# Patient Record
Sex: Male | Born: 1990 | Race: Black or African American | Hispanic: No | Marital: Single | Smoking: Never smoker
Health system: Southern US, Community
[De-identification: ages and names within clinical notes are randomized; demographics above are authoritative.]

---

## 2012-10-08 ENCOUNTER — Emergency Department (HOSPITAL_COMMUNITY)
Admission: EM | Admit: 2012-10-08 | Discharge: 2012-10-09 | Disposition: A | Discharge status: Home / Self Care | Payer: Medicaid Other | Attending: Emergency Medicine | Admitting: Emergency Medicine

## 2012-10-08 DIAGNOSIS — S81812A Laceration without foreign body, left lower leg, initial encounter: Secondary | ICD-10-CM

## 2012-10-08 DIAGNOSIS — S6980XA Other specified injuries of unspecified wrist, hand and finger(s), initial encounter: Secondary | ICD-10-CM | POA: Insufficient documentation

## 2012-10-08 DIAGNOSIS — S6990XA Unspecified injury of unspecified wrist, hand and finger(s), initial encounter: Secondary | ICD-10-CM | POA: Insufficient documentation

## 2012-10-08 DIAGNOSIS — Y9289 Other specified places as the place of occurrence of the external cause: Secondary | ICD-10-CM | POA: Insufficient documentation

## 2012-10-08 DIAGNOSIS — Y9389 Activity, other specified: Secondary | ICD-10-CM | POA: Insufficient documentation

## 2012-10-08 DIAGNOSIS — S6992XA Unspecified injury of left wrist, hand and finger(s), initial encounter: Secondary | ICD-10-CM

## 2012-10-08 DIAGNOSIS — IMO0002 Reserved for concepts with insufficient information to code with codable children: Secondary | ICD-10-CM | POA: Insufficient documentation

## 2012-10-08 DIAGNOSIS — S81009A Unspecified open wound, unspecified knee, initial encounter: Secondary | ICD-10-CM | POA: Insufficient documentation

## 2012-10-08 NOTE — ED Notes (Signed)
Pt states he was at the [facility] jumping up on platform and hit his knee. LAC to left knee approximately 2 inches

## 2012-10-09 ENCOUNTER — Emergency Department (HOSPITAL_COMMUNITY): Payer: Medicaid Other

## 2012-10-09 MED ORDER — BACITRACIN 500 UNIT/GM EX OINT
1.0000 "application " | TOPICAL_OINTMENT | Freq: Two times a day (BID) | CUTANEOUS | Status: DC
  Administered 2012-10-09: 1 via TOPICAL

## 2012-10-09 NOTE — ED Notes (Signed)
Suture cart at bedside 

## 2012-10-09 NOTE — ED Notes (Signed)
Pt reports last tetanus was in 2006.

## 2012-10-09 NOTE — ED Provider Notes (Signed)
CSN: [id number]     Arrival date & time 10/08/12  2209 History   First MD Initiated Contact with Patient 10/08/12 2354     Chief Complaint  Patient presents with  . Leg Injury   (Consider location/radiation/quality/duration/timing/severity/associated sxs/prior Treatment) HPI Comments: At the Gym hit leg in shelf now with abrasion/laceration to anterior left shin  Also states he dislocated his left 5th finger --pulled on it and got it straight but now can not straighten the finger.   The history is provided by the patient.    No past medical history on file. No past surgical history on file. No family history on file. History  Substance Use Topics  . Smoking status: Not on file  . Smokeless tobacco: Not on file  . Alcohol Use: Not on file    Review of Systems  Constitutional: Negative for fever.  Musculoskeletal: Positive for arthralgias.  Skin: Positive for wound.  All other systems reviewed and are negative.    Allergies  Review of patient's allergies indicates no known allergies.  Home Medications  No current outpatient prescriptions on file. BP 126/66  Pulse 78  Temp(Src) 98.9 F (37.2 C) (Oral)  Resp 19  SpO2 99% Physical Exam  Nursing note and vitals reviewed. Constitutional: He is oriented to person, place, and time. He appears well-developed and well-nourished.  HENT:  Head: Normocephalic.  Eyes: Pupils are equal, round, and reactive to light.  Cardiovascular: Normal rate.   Pulmonary/Chest: Effort normal.  Musculoskeletal: He exhibits tenderness. He exhibits no edema.       Hands: Neurological: He is alert and oriented to person, place, and time.  Skin: Laceration noted.     superfical abrasion to anterior shin Laceration.5 CM  to anterior shin     ED Course  Procedures (including critical care time) Labs Review Labs Reviewed - No data to display Imaging Review Dg Finger Little Left  10/09/2012   *RADIOLOGY REPORT*  Clinical Data: Trauma 1 week  to go.  LEFT LITTLE FINGER 2+V  Comparison: None available at time of study interpretation.  Findings: No acute fracture deformity or dislocation. The fifth proximal interphalangeal joint is flexed in all views.  Mild spurring of the base of the fifth proximal phalanx.  Joint space intact without erosions.  No destructive bony lesions. Mild soft tissue swelling without subcutaneous gas nor radiopaque foreign bodies.  IMPRESSION: No acute fracture deformity dislocation.  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   Original Report Authenticated By: Awilda Metro    MDM   1. Laceration of lower leg, left, initial encounter   2. Finger injury, left, initial encounter    Patient placed in finger splint to call DR. Coley on Monday for further evaluation     Arman Filter, NP 10/09/12 6578  Arman Filter, NP 10/09/12 [phone]

## 2012-10-09 NOTE — ED Provider Notes (Signed)
Medical screening examination/treatment/procedure(s) were performed by non-physician practitioner and as supervising physician I was immediately available for consultation/collaboration.   Jeison Delpilar M Easter Kennebrew, MD 10/09/12 0825 

## 2012-10-19 ENCOUNTER — Emergency Department (HOSPITAL_COMMUNITY)
Admission: EM | Admit: 2012-10-19 | Discharge: 2012-10-19 | Disposition: A | Discharge status: Home / Self Care | Payer: Medicaid Other | Attending: Emergency Medicine | Admitting: Emergency Medicine

## 2012-10-19 ENCOUNTER — Encounter (HOSPITAL_COMMUNITY): Payer: Self-pay | Admitting: Emergency Medicine

## 2012-10-19 DIAGNOSIS — Z4802 Encounter for removal of sutures: Secondary | ICD-10-CM | POA: Insufficient documentation

## 2012-10-19 NOTE — ED Provider Notes (Signed)
CSN: [id number]     Arrival date & time 10/19/12  1434 History  This chart was scribed for non-physician practitioner Dierdre Forth, PA-C, working with No att. providers found by Dorothey Baseman, ED Scribe. This patient was seen in room TR07C/TR07C and the patient's care was started at 3:23 PM.    Chief Complaint  Patient presents with  . Suture / Staple Removal    L shin    Patient is a 22 y.o. male presenting with suture removal. The history is provided by the patient. No language interpreter was used.  Suture / Staple Removal This is a new problem. The current episode started more than 1 week ago. The problem has been gradually improving.   HPI Comments: Kevin Harrison is a 22 y.o. male who presents to the Emergency Department requesting suture removal for a laceration to the left shin that occurred 10 days ago while at the gym. He denies any drainage from the area, fever, chills, nausea, vomiting, or color change. Patient reports that his tetanus was updated at the time. Patient reports that he has an appointment to follow up with a hand surgeon for an injury to the left finger.   History reviewed. No pertinent past medical history. History reviewed. No pertinent past surgical history. No family history on file. History  Substance Use Topics  . Smoking status: Never Smoker   . Smokeless tobacco: Not on file  . Alcohol Use: No    Review of Systems  Constitutional: Negative for fever and chills.  Gastrointestinal: Negative for nausea and vomiting.  Skin: Positive for wound. Negative for color change.    Allergies  Review of patient's allergies indicates no known allergies.  Home Medications  No current outpatient prescriptions on file.  Triage Vitals: BP 106/53  Pulse 55  Temp(Src) 97.8 F (36.6 C) (Oral)  Resp 18  Ht 6' (1.829 m)  Wt 171 lb (77.565 kg)  BMI 23.19 kg/m2  SpO2 98%  Physical Exam  Nursing note and vitals reviewed. Constitutional: He is oriented to  person, place, and time. He appears well-developed and well-nourished. No distress.  HENT:  Head: Normocephalic and atraumatic.  Eyes: Conjunctivae are normal. No scleral icterus.  Neck: Normal range of motion.  Cardiovascular:  Capillary refill < 3 sec  Musculoskeletal: Normal range of motion. He exhibits no edema.  ROM: Full ROM LLE  Neurological: He is alert and oriented to person, place, and time.  Sensation: inatact Strength: 5/5 in the LLE  Skin: Skin is warm and dry. He is not diaphoretic. No erythema.  7 cm laceration to anterior left leg without drainage that is non-erythematous and appears to be healing well. One small part near the center of the wound is still open.  Psychiatric: He has a normal mood and affect.    ED Course  Procedures (including critical care time)  DIAGNOSTIC STUDIES: Oxygen Saturation is 98% on room air, normal by my interpretation.    COORDINATION OF CARE: 3:25PM- Performed a suture removal of the area. Advised patient to keep the area clean and dry. Advised patient to follow up if there are any new or worsening symptoms, especially signs of infection. Discussed treatment plan with patient at bedside and patient verbalized agreement.   SUTURE REMOVAL Performed by: Dierdre Forth, PA-C Authorized by: No att. providers found Consent: Verbal consent obtained. Consent given by: patient Required items: required blood products, implants, devices, and special equipment available  Time out: Immediately prior to procedure a "time out" was  called to verify the correct patient, procedure, equipment, support staff and site/side marked as required. Location: left shin Wound Appearance: clean, non-erythematous Staples Removed: 5 Post-removal: steri strip Patient tolerance: Patient tolerated the procedure well with no immediate complications.    Labs Review Labs Reviewed - No data to display Imaging Review No results found.  MDM   1. Visit for  suture removal      Kevin Harrison presents for staple/suture removal and wound check as above. Procedure tolerated well. Small 1cm opening in the center of the laceration remains open, but healthy tissue is visible underneath.  Steri-strip placed.  Vitals normal, no signs of infection. Scar minimization & return precautions given at dc.    I personally performed the services described in this documentation, which was scribed in my presence. The recorded information has been reviewed and is accurate.  Dierdre Forth, PA-C 10/19/12 1541  Green Quincy, PA-C 10/19/12 2045

## 2012-10-19 NOTE — ED Notes (Signed)
Patient here for removal of sutures that were placed 10 days ago.   Patient states L shin sutures.

## 2012-10-21 NOTE — ED Provider Notes (Signed)
Medical screening examination/treatment/procedure(s) were performed by non-physician practitioner and as supervising physician I was immediately available for consultation/collaboration.   Kimberlye Dilger B. Navah Grondin, MD 10/21/12 2029 

## 2014-11-09 IMAGING — CR DG FINGER LITTLE 2+V*L*
3 series · 3 of 3 positions shown · non-contrast
Comparison: None available at time of study interpretation.

CLINICAL DATA: Trauma 1 week to go.

LEFT LITTLE FINGER 2+V

[x finger pa left]
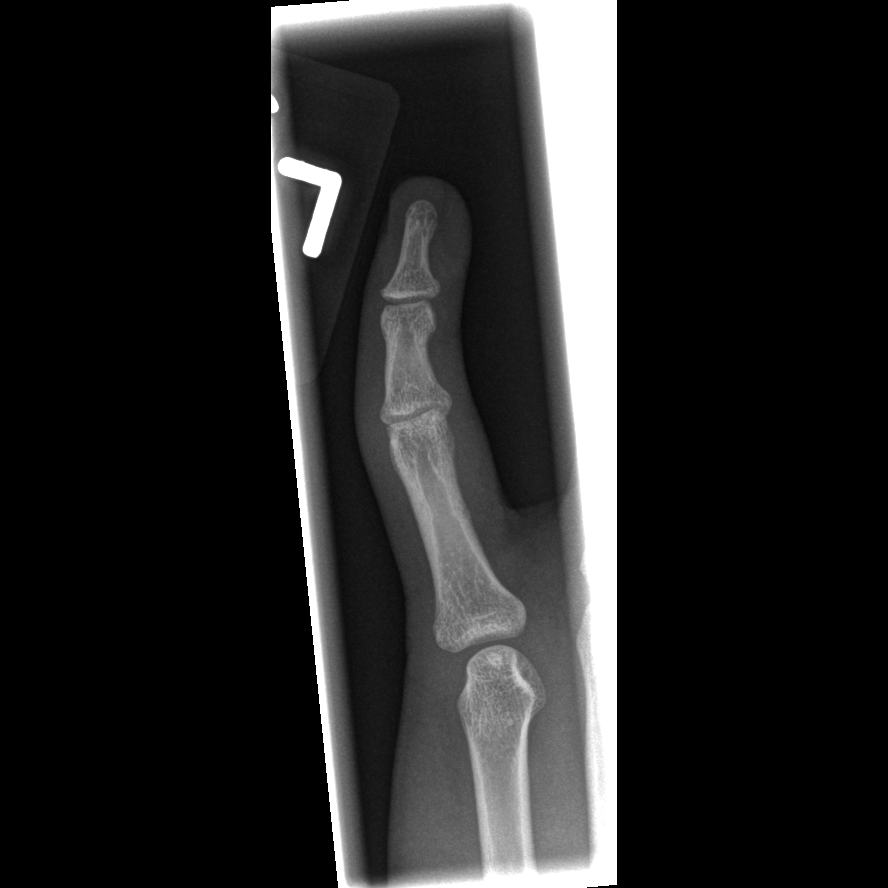

[x finger obl. left]
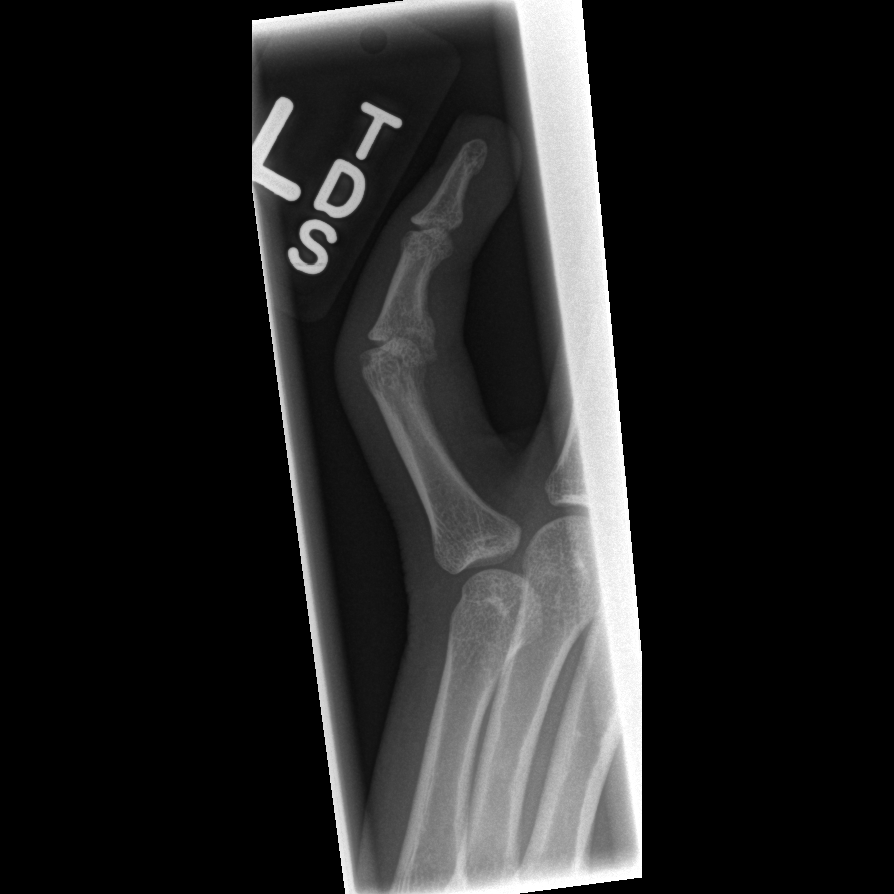

[x finger lateral left]
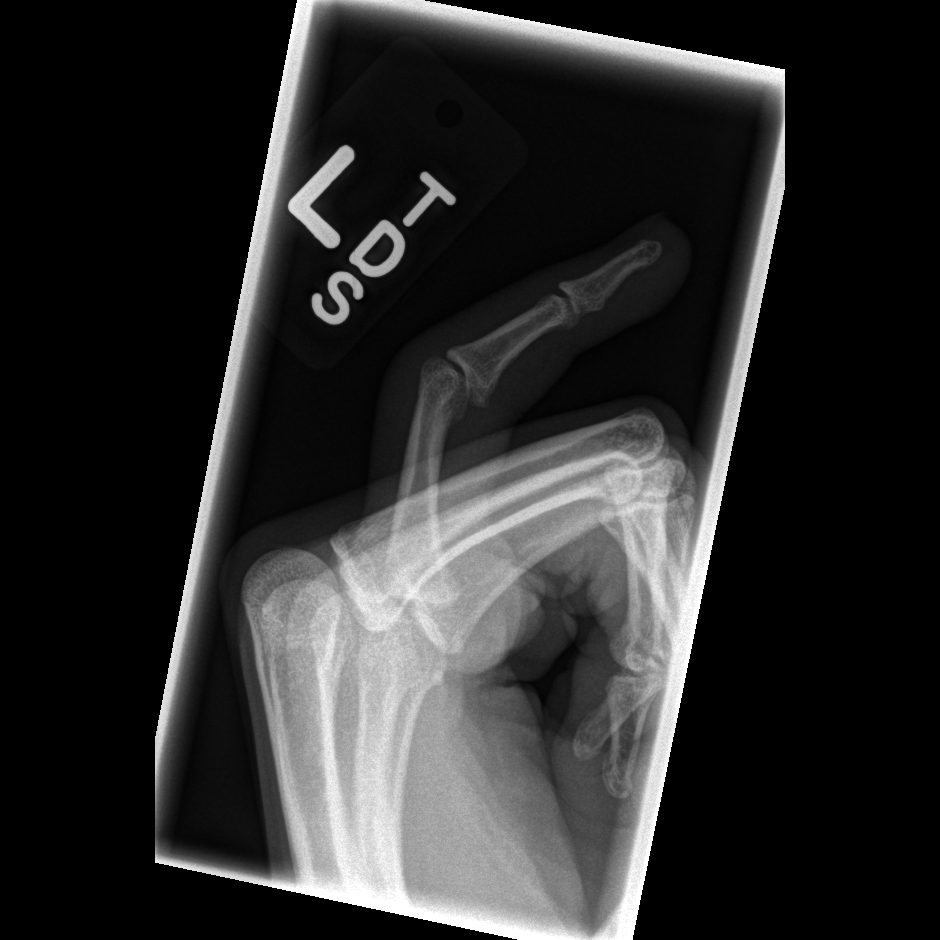

[3 of 3 positions shown; findings below may reference images not displayed]

FINDINGS: No acute fracture deformity or dislocation. The fifth
proximal interphalangeal joint is flexed in all views.  Mild
spurring of the base of the fifth proximal phalanx.  Joint space
intact without erosions.  No destructive bony lesions. Mild soft
tissue swelling without subcutaneous gas nor radiopaque foreign
bodies.
IMPRESSION: No acute fracture deformity dislocation.

Persistently flexed fifth proximal interphalangeal joint may
reflect remote ligamentous injury with degenerative change
(Boutonniere deformity).

## 2014-12-04 ENCOUNTER — Ambulatory Visit: Payer: Medicaid Other | Admitting: Family Medicine

## 2015-06-17 ENCOUNTER — Encounter (HOSPITAL_COMMUNITY): Payer: Self-pay | Admitting: Emergency Medicine

## 2015-06-17 ENCOUNTER — Emergency Department (HOSPITAL_COMMUNITY)
Admission: EM | Admit: 2015-06-17 | Discharge: 2015-06-17 | Disposition: A | Discharge status: Home / Self Care | Payer: Medicaid Other | Attending: Emergency Medicine | Admitting: Emergency Medicine

## 2015-06-17 DIAGNOSIS — I889 Nonspecific lymphadenitis, unspecified: Secondary | ICD-10-CM | POA: Insufficient documentation

## 2015-06-17 DIAGNOSIS — J029 Acute pharyngitis, unspecified: Secondary | ICD-10-CM | POA: Insufficient documentation

## 2015-06-17 DIAGNOSIS — R590 Localized enlarged lymph nodes: Secondary | ICD-10-CM

## 2015-06-17 LAB — CBC WITH DIFFERENTIAL/PLATELET
Basophils Absolute: 0 10*3/uL (ref 0.0–0.1)
Basophils Relative: 0 %
Eosinophils Absolute: 0.1 10*3/uL (ref 0.0–0.7)
Eosinophils Relative: 3 %
HCT: 42.3 % (ref 39.0–52.0)
HEMOGLOBIN: 14.1 g/dL (ref 13.0–17.0)
LYMPHS PCT: 57 %
Lymphs Abs: 2.2 10*3/uL (ref 0.7–4.0)
MCH: 30.6 pg (ref 26.0–34.0)
MCHC: 33.3 g/dL (ref 30.0–36.0)
MCV: 91.8 fL (ref 78.0–100.0)
Monocytes Absolute: 0.4 10*3/uL (ref 0.1–1.0)
Monocytes Relative: 10 %
NEUTROS PCT: 30 %
Neutro Abs: 1.2 10*3/uL — ABNORMAL LOW (ref 1.7–7.7)
Platelets: 258 10*3/uL (ref 150–400)
RBC: 4.61 MIL/uL (ref 4.22–5.81)
RDW: 12.6 % (ref 11.5–15.5)
WBC: 3.9 10*3/uL — AB (ref 4.0–10.5)

## 2015-06-17 NOTE — ED Provider Notes (Signed)
CSN: [id number]     Arrival date & time 06/17/15  0902 History  By signing my name below, I, Kevin Harrison, attest that this documentation has been prepared under the direction and in the presence of Kevin Crigler, PA-C.   Electronically Signed: Hollace Harrison, ED Scribe. 06/17/2015. 9:33 AM.   Chief Complaint  Patient presents with  . Neck Pain   The history is provided by the patient. No language interpreter was used.   HPI Comments: Kevin Harrison is a 25 y.o. male who presents to the Emergency Department complaining of swollen lymph nodes on his L neck. Pt reports one of his lymph nodes in L neck has been chronically swollen x 8 years, and recently more lymph nodes in the area started to swell. Pt reports that when his sx began that he had an associated sore throat that has since resolved. Pt denies any lymph node swellings to other areas. Pts mother states that they have tried to ice the area to relieve the swelling with no relief. Pt has no recently been sick, or had known sick contact. Pt denies night sweats, fevers, weight loss or rhinorhea. Pt is not currently followed by a PCP.  History reviewed. No pertinent past medical history. History reviewed. No pertinent past surgical history. No family history on file. Social History  Substance Use Topics  . Smoking status: Never Smoker   . Smokeless tobacco: None  . Alcohol Use: No    Review of Systems  Constitutional: Negative for fever, chills, diaphoresis and fatigue.  HENT: Positive for sore throat. Negative for congestion, ear pain, rhinorrhea and sinus pressure.   Eyes: Negative for redness.  Respiratory: Negative for cough and wheezing.   Gastrointestinal: Negative for nausea, vomiting, abdominal pain and diarrhea.  Genitourinary: Negative for dysuria.  Musculoskeletal: Negative for myalgias and neck stiffness.  Skin: Negative for rash.  Neurological: Negative for headaches.  Hematological: Positive for adenopathy.     Allergies  Review of patient's allergies indicates no known allergies.  Home Medications   Prior to Admission medications   Not on File   BP 121/82 mmHg  Pulse 83  Temp(Src) 98.7 F (37.1 C) (Oral)  Resp 16  SpO2 99%   Physical Exam  Constitutional: He appears well-developed and well-nourished.  HENT:  Head: Normocephalic and atraumatic.  Mouth/Throat: Oropharynx is clear and moist.  No intraoral lesions.   Eyes: Conjunctivae are normal.  Neck: Normal range of motion. Neck supple.  Cardiovascular: Normal rate.   Pulmonary/Chest: Effort normal. No respiratory distress.  Abdominal: He exhibits no distension.  Musculoskeletal: Normal range of motion.  Lymphadenopathy:       Head (right side): No submental, no submandibular, no tonsillar, no preauricular, no posterior auricular and no occipital adenopathy present.       Head (left side): No submental, no submandibular, no tonsillar, no preauricular, no posterior auricular and no occipital adenopathy present.    He has cervical adenopathy.       Right cervical: No posterior cervical adenopathy present.      Left cervical: Posterior cervical adenopathy present.       Right axillary: No pectoral and no lateral adenopathy present.       Left axillary: No pectoral and no lateral adenopathy present.      Right: No supraclavicular and no epitrochlear adenopathy present.       Left: No supraclavicular and no epitrochlear adenopathy present.  Multiple mobile, non-tender L cervical nodes.   Neurological: He is alert.  Skin: Skin is warm and dry.  Psychiatric: He has a normal mood and affect. His behavior is normal.  Nursing note and vitals reviewed.   ED Course  Procedures (including critical care time) DIAGNOSTIC STUDIES: Oxygen Saturation is 99% on RA, normal by my interpretation.   COORDINATION OF CARE: 9:33 AM-Discussed next steps with pt including a CBC and referral for f/u w/ a PCP. Pt verbalized understanding and is  agreeable with the plan.   Labs Review Labs Reviewed  CBC WITH DIFFERENTIAL/PLATELET - Abnormal; Notable for the following:    WBC 3.9 (*)    Neutro Abs 1.2 (*)    All other components within normal limits   10:21 AM labs revealed slight neutropenia. Patient given PCP referrals and encouraged follow-up. Return with worsening symptoms or other concerns.  MDM   Final diagnoses:  Lymphadenopathy, posterior cervical   Patient with acute on chronic swelling of left cervical lymph nodes. CBC is essentially normal. PCP follow-up indicated for consideration of further workup if swelling does not improve.  I personally performed the services described in this documentation, which was scribed in my presence. The recorded information has been reviewed and is accurate.     Kevin CriglerJoshua Arti Trang, PA-C 06/17/15 1022  Kevin Pulleyaniel Knott, MD 06/18/15 [phone]

## 2015-06-17 NOTE — Discharge Instructions (Signed)
Please read and follow all provided instructions.  Your diagnoses today include:  1. Lymphadenopathy, posterior cervical     Tests performed today include:  Blood cell count - no concerning findings  Vital signs. See below for your results today.   Medications prescribed:   None  Take any prescribed medications only as directed.  Home care instructions:  Follow any educational materials contained in this packet.  BE VERY CAREFUL not to take multiple medicines containing Tylenol (also called acetaminophen). Doing so can lead to an overdose which can damage your liver and cause liver failure and possibly death.   Follow-up instructions: Please follow-up with your primary care provider in the next 2 weeks for further evaluation of your symptoms.   Return instructions:   Please return to the Emergency Department if you experience worsening symptoms.   Please return if you have any other emergent concerns.  Additional Information:  Your vital signs today were: BP 121/82 mmHg   Pulse 83   Temp(Src) 98.7 F (37.1 C) (Oral)   Resp 16   SpO2 99% If your blood pressure (BP) was elevated above 135/85 this visit, please have this repeated by your doctor within one month. -------------- [employer]stateCommunity Resource Guide Financial Assistance The United Ways 211 is a great source of information about community services available.  Access by dialing 2-1-1 from anywhere in [place], or by website -  PooledIncome.plwww.nc211.org.   Other Local Resources (Updated 01/2015)  Financial Assistance   Services    Phone Number and Address  [facility]  Low-cost medical care - 1st and 3rd Saturday of every month  Must not qualify for public or private insurance and must have limited income [phone] [address] [place], [place]    [place] [employer] [place] [facility] of Social Services  Child care  Emergency assistance for housing and Kimberly-Clarkutilities  Food stamps  Medicaid  [phone] [address] [place], [place] [address]   [facility]  Low-cost medical care for children, communicable diseases, sexually-transmitted diseases, immunizations, maternity care, womens health and family planning [phone] [address] [place], [place] [address]  [facility] Med[facility] Management [facility]   Medication assistance for [facility] residents  Must meet income requirements [phone] [place], [place].    [facility]  Child care  Emergency assistance for housing and Kimberly-Clarkutilities  Food stamps  Medicaid [phone] [address] [place], [place] [address]  [facility]   Low-cost medical care,   Monday through Friday, 9 am to 6 pm.   Accepts Medicare/Medicaid, and self-pay [phone] [address] [place], [place] [address]  [facility]  Low-cost medical care - Monday through Friday, 8:30 am - 5:30 pm  Accepts Medicaid and self-pay [phone] [address] [place], [place] [address]   [facility]  Primary medical care, including for those with sickle cell disease  Accepts Medicare, Medicaid, insurance and self-pay [phone] [address] [place], [place]  [facility]   Primary medical care  Accepts Medicare, [place], insurance and self-pay [phone] [address] [place], [place] [address]   [facility] Department of Social Services  Child care  Emergency assistance for housing and Kimberly-Clarkutilities  Food stamps  Medicaid [phone] [address] [place], [place] [address]  [facility] Department of Health and [employer]Human Services  Child care  Emergency assistance for housing and utilities  Food stamps  Medicaid [phone] [address] [place], [place] [address]   [facility] Medication  Assistance Program  Medication assistance for [facility] residents with no insurance only  Must have a primary care doctor [phone] [address] [place], [place]  [facility]   Primary medical care  [place], [place], insurance  [phone] [place], [place]  MedAssist   Medication assistance [phone]  [facility]   Primary medical care  Accepts Medicare, [place], insurance and self-pay [phone] [address] [place], [place] [address]  [facility]   Primary medical care  Accepts Medicare, [place], insurance and self-pay [phone] [address] [place], [place] [address]  [facility]  For [place] residents between the ages of 42 and 48 who do not have any form of health insurance, Medicare, [place], or [place] benefits.  Services are provided free of charge to uninsured patients who fall within federal poverty guidelines.    Hours: Tuesdays and Thursdays, 4:15 - 8 pm [phone] [address] [place], [place] [address]  [facility]     Primary medical care  Dental care  Nutritional counseling  Pharmacy  Accepts Medicaid, Medicare, most insurance.  Fees are adjusted based on ability to pay.   [phone] [facility] [address] [place], [place]  [phone] [facility] [address] [place], [place]  [phone] [facility] [place], [place]  [phone] [facility], [address] [place], [place]  [phone] [facility] [address] [place], [place]  [facility]  Womens health and family planning [phone] [address] [place] [place], [place]  [facility] Department of Social Services  Child care  Emergency assistance for housing and  Kim[employer]Clark  Medicaid [phone] [place], [place] [address]   [facility]    Ages 38 and older  Hours: Mondays and Thursdays, 7:00 am - 9:00 am Patients are seen on a first come, first served basis. [phone], ext. [phone] [place], [place]  [facility] Division of Social Services  Child care  Emergency assistance for housing and [employer]  Medicaid [phone] [address] [place], [place] [address]  [facility]  Medication assistance  Rental assistance  Food pantry  Medication assistance  Housing assistance  Emergency food distribution  Utility assistance [phone] [address] [place], [place]  [phone]  [address] [place], [place] [address] Hours: Tuesdays and Thursdays from 9am - 12 noon by appointment only  [phone] [address] [place], [place] [address]  [facility] Lanae Boast   Accepts private insurance, [place], and [place].  Payment is based on a sliding scale for those without insurance.  Hours: Mondays, Tuesdays and Thursdays, 8:30 am - 5:30 pm.   [phone] [address] [place], [place]  [facility] - Family Medicine at [facility], [place], and [place].  Payment is based on a sliding scale for those without insurance. [phone] [address] [place], [place]  [facility] - Pediatrics at E. Scientist, research (physical sciences), [employer], and [place].  Payment is based on a sliding scale for those without insurance [phone] [address], [place], [place]  [facility] - Pediatrics at  [employer], Medicare, and Medicaid.  Payment is based on a sliding scale for those without insurance. [phone] [address] [place], [place]  [facility] - Pediatrics at [facility], [place], and [place].  Payment is based on a sliding scale for those without insurance. [phone], ext. [phone] [place], [place].    [facility]  Maternity care.  Accepts Medicaid and self-pay. [phone] [address] [place], [place]

## 2015-06-17 NOTE — ED Notes (Signed)
Feels like his lymph nodes  In his neck are swollen x 2 weeks , denies cough coldd now had sore throat now bertter

## 2015-06-17 NOTE — ED Notes (Signed)
Declined W/C at D/C and was escorted to lobby by RN. 

## 2015-08-01 ENCOUNTER — Encounter: Payer: Self-pay | Admitting: Family Medicine

## 2015-08-01 ENCOUNTER — Ambulatory Visit (INDEPENDENT_AMBULATORY_CARE_PROVIDER_SITE_OTHER): Payer: PRIVATE HEALTH INSURANCE | Admitting: Family Medicine

## 2015-08-01 ENCOUNTER — Other Ambulatory Visit: Payer: Self-pay | Admitting: Family Medicine

## 2015-08-01 VITALS — BP 116/67 | HR 46 | Temp 98.0°F | Resp 14 | Ht 72.5 in | Wt 162.0 lb

## 2015-08-01 DIAGNOSIS — R197 Diarrhea, unspecified: Secondary | ICD-10-CM

## 2015-08-01 DIAGNOSIS — Z114 Encounter for screening for human immunodeficiency virus [HIV]: Secondary | ICD-10-CM

## 2015-08-01 LAB — COMPLETE METABOLIC PANEL WITH GFR
ALK PHOS: 46 U/L (ref 40–115)
ALT: 33 U/L (ref 9–46)
AST: 40 U/L (ref 10–40)
Albumin: 4.2 g/dL (ref 3.6–5.1)
BUN: 13 mg/dL (ref 7–25)
CO2: 27 mmol/L (ref 20–31)
Calcium: 9.7 mg/dL (ref 8.6–10.3)
Chloride: 104 mmol/L (ref 98–110)
Creat: 0.9 mg/dL (ref 0.60–1.35)
Glucose, Bld: 77 mg/dL (ref 65–99)
Potassium: 4 mmol/L (ref 3.5–5.3)
Sodium: 140 mmol/L (ref 135–146)
Total Bilirubin: 1.8 mg/dL — ABNORMAL HIGH (ref 0.2–1.2)
Total Protein: 7 g/dL (ref 6.1–8.1)

## 2015-08-01 NOTE — Patient Instructions (Signed)
Continue to try to avoid those foods that cause diarrhea. We will let you know if anything in your labs needs attention.

## 2015-08-01 NOTE — Progress Notes (Signed)
Patient ID: Mariah Harn, male   DOB: 1990-04-18, 25 y.o.   MRN: [id number]   Meer Reindl, is a 25 y.o. male  [id number]  [id number]  DOB - Jan 26, 1990  CC:  Chief Complaint  Patient presents with  . Establish Care  . Abdominal Pain    after eating   . Diarrhea  . Decreased Visual Acuity       HPI: Undra Harriman is a 25 y.o. male here to establish care. He was seen in ED recently for cervical lympadopathy. No reason was found and he reports the lumps are gone. He is referred here to establish primary care. His only compliant is of diarrhea after eating certain foods, particularly greasy foods. He takes no medications on a regular basis. He has noticed his vision seems to be declining. He has not seen a dentist is several years. He has not been screened for HIV. He denies tobacco, alcohol or drug use. He follows no particular dietary plan and does not exercise regularly.  No Known Allergies History reviewed. No pertinent past medical history. No current outpatient prescriptions on file prior to visit.   No current facility-administered medications on file prior to visit.   Family History  Problem Relation Age of Onset  . Diabetes Maternal Grandmother   . Diabetes Paternal Grandmother    Social History   Social History  . Marital Status: Single    Spouse Name: N/A  . Number of Children: N/A  . Years of Education: N/A   Occupational History  . Not on file.   Social History Main Topics  . Smoking status: Never Smoker   . Smokeless tobacco: Not on file  . Alcohol Use: No  . Drug Use: No  . Sexual Activity: Not on file   Other Topics Concern  . Not on file   Social History Narrative    Review of Systems: Constitutional: Negative for fever, chills, appetite change, weight loss,  Fatigue. Skin: Negative for rashes or lesions of concern. HENT: Negative for ear pain, ear discharge.nose bleeds Eyes: Negative for pain, discharge, redness, itching. Decline  in vision Neck: Negative for pain, stiffness Respiratory: Negative for cough, shortness of breath,   Cardiovascular: Negative for chest pain, palpitations and leg swelling. Gastrointestinal: Negative for abdominal pain, nausea, vomiting, constipations. Positive for diarrhea related to certain foods. Genitourinary: Negative for dysuria, urgency, frequency, hematuria,  Musculoskeletal: Negative for back pain, joint pain, joint  swelling, and gait problem.Negative for weakness. Neurological: Negative for dizziness, tremors, seizures, syncope,   light-headedness, numbness and headaches.  Hematological: Negative for easy bruising or bleeding Psychiatric/Behavioral: Negative for depression, anxiety, decreased concentration, confusion   Objective:   Filed Vitals:   08/01/15 0909  BP: 116/67  Pulse: 46  Temp: 98 F (36.7 C)  Resp: 14    Physical Exam: Constitutional: Patient appears well-developed and well-nourished. No distress. HENT: Normocephalic, atraumatic, External right and left ear normal. Oropharynx is clear and moist.  Eyes: Conjunctivae and EOM are normal. PERRLA, no scleral icterus. Neck: Normal ROM. Neck supple. No lymphadenopathy, No thyromegaly. CVS: RRR, S1/S2 +, no murmurs, no gallops, no rubs Pulmonary: Effort and breath sounds normal, no stridor, rhonchi, wheezes, rales.  Abdominal: Soft. Normoactive BS,, no distension, tenderness, rebound or guarding.  Musculoskeletal: Normal range of motion. No edema and no tenderness.  Neuro: Alert.Normal muscle tone coordination. Non-focal Skin: Skin is warm and dry. No rash noted. Not diaphoretic. No erythema. No pallor. Psychiatric: Normal mood and affect. Behavior, judgment, thought content normal.  Lab Results  Component Value Date   WBC 3.9* 06/17/2015   HGB 14.1 06/17/2015   HCT 42.3 06/17/2015   MCV 91.8 06/17/2015   PLT 258 06/17/2015   No results found for: CREATININE, BUN, NA, K, CL, CO2  No results found for:  HGBA1C Lipid Panel  No results found for: CHOL, TRIG, HDL, CHOLHDL, VLDL, LDLCALC     Assessment and plan:   1. Diarrhea, unspecified type  - COMPLETE METABOLIC PANEL WITH GFR  2. Screening for HIV (human immunodeficiency virus)  - HIV antibody (with reflex)   Return in about 1 year (around 07/31/2016).  The patient was given clear instructions to go to ER or return to medical center if symptoms don't improve, worsen or new problems develop. The patient verbalized understanding.    Henrietta HooverLinda C Sabri Teal FNP  08/01/2015, 10:22 AM

## 2015-08-02 LAB — HIV ANTIBODY (ROUTINE TESTING W REFLEX): HIV 1&2 Ab, 4th Generation: NONREACTIVE

## 2015-12-21 ENCOUNTER — Emergency Department (HOSPITAL_COMMUNITY)
Admission: EM | Admit: 2015-12-21 | Discharge: 2015-12-21 | Disposition: A | Discharge status: Home / Self Care | Payer: PRIVATE HEALTH INSURANCE | Attending: Emergency Medicine | Admitting: Emergency Medicine

## 2015-12-21 ENCOUNTER — Encounter (HOSPITAL_COMMUNITY): Payer: Self-pay | Admitting: Emergency Medicine

## 2015-12-21 DIAGNOSIS — K0889 Other specified disorders of teeth and supporting structures: Secondary | ICD-10-CM | POA: Insufficient documentation

## 2015-12-21 MED ORDER — HYDROCODONE-ACETAMINOPHEN 5-325 MG PO TABS
1.0000 | ORAL_TABLET | Freq: Once | ORAL | Status: AC
  Administered 2015-12-21: 1 via ORAL
  Filled 2015-12-21: qty 1

## 2015-12-21 MED ORDER — AMOXICILLIN 500 MG PO CAPS: 500.0000 mg | ORAL_CAPSULE | Freq: Three times a day (TID) | ORAL | 0 refills | Status: DC

## 2015-12-21 MED ORDER — NAPROXEN 500 MG PO TABS: 500.0000 mg | ORAL_TABLET | Freq: Two times a day (BID) | ORAL | 0 refills | Status: DC

## 2015-12-21 MED ORDER — AMOXICILLIN 500 MG PO CAPS
500.0000 mg | ORAL_CAPSULE | Freq: Once | ORAL | Status: AC
  Administered 2015-12-21: 500 mg via ORAL
  Filled 2015-12-21: qty 1

## 2015-12-21 NOTE — ED Notes (Signed)
Pt understood dc material. NAD noted. Scripts given  

## 2015-12-21 NOTE — ED Triage Notes (Signed)
Patient reports persistent left upper molar pain onset yesterday unrelieved by OTC Tylenol .

## 2015-12-21 NOTE — Discharge Instructions (Signed)
Follow-up with a dentist as soon as possible.

## 2015-12-21 NOTE — ED Provider Notes (Signed)
[facility]-EMERGENCY DEPT Provider Note   CSN: [id number] Arrival date & time: 12/21/15  2026  By signing my name below, I, Kevin Harrison, attest that this documentation has been prepared under the direction and in the presence of  [facility]. Damian LeavellNeese, NP. Electronically Signed: Doreatha MartinEva Harrison, ED Scribe. 12/21/15. 9:47 PM.    History   Chief Complaint Chief Complaint  Patient presents with  . Dental Pain    HPI Kevin ReddenChristian Harrison is a 25 y.o. male otherwise healthy on no daily medications who presents to the Emergency Department complaining of moderate left upper dental pain x 2 days. Pt states a filling in the tooth causing him pain fell out months ago, but it has not caused him pain until the last 2 days. He states he has tried Tylenol with no relief of pain. No h/o similar issues. Pt is not currently followed by a dentist. He denies fever.   The history is provided by the patient. No language interpreter was used.  Dental Pain   This is a new problem. The current episode started 2 days ago. The problem occurs constantly. The problem has not changed since onset.The pain is moderate. He has tried acetaminophen for the symptoms. The treatment provided no relief.    History reviewed. No pertinent past medical history.  There are no active problems to display for this patient.   History reviewed. No pertinent surgical history.     Home Medications    Prior to Admission medications   Medication Sig Start Date End Date Taking? Authorizing Provider  amoxicillin (AMOXIL) 500 MG capsule Take 1 capsule (500 mg total) by mouth 3 (three) times daily. 12/21/15   Nysha Koplin Orlene OchM Deashia Soule, NP  naproxen (NAPROSYN) 500 MG tablet Take 1 tablet (500 mg total) by mouth 2 (two) times daily. 12/21/15   Azarion Hove Orlene OchM Riley Papin, NP    Family History Family History  Problem Relation Age of Onset  . Diabetes Maternal Grandmother   . Diabetes Paternal Grandmother     Social History Social History  Substance Use Topics  . Smoking  status: Never Smoker  . Smokeless tobacco: Never Used  . Alcohol use No     Allergies   Patient has no known allergies.   Review of Systems Review of Systems  Constitutional: Negative for fever.  HENT: Positive for dental problem.   All other systems reviewed and are negative.    Physical Exam Updated Vital Signs BP 124/65 (BP Location: Right Arm)   Pulse 67   Temp 98.6 F (37 C) (Oral)   Resp 18   Ht 6' (1.829 m)   Wt 79.2 kg   SpO2 98%   BMI 23.67 kg/m   Physical Exam  Constitutional: He appears well-developed and well-nourished.  HENT:  Head: Normocephalic.  The upper left 3rd molar, the filling has come out. Nontender on exam.   Eyes: Conjunctivae are normal.  Cardiovascular: Normal rate and regular rhythm.   Pulmonary/Chest: Effort normal and breath sounds normal. No respiratory distress. He has no wheezes. He has no rales.  Abdominal: He exhibits no distension.  Musculoskeletal: Normal range of motion.  Neurological: He is alert.  Skin: Skin is warm and dry.  Psychiatric: He has a normal mood and affect. His behavior is normal.  Nursing note and vitals reviewed.    ED Treatments / Results   DIAGNOSTIC STUDIES: Oxygen Saturation is 100% on RA, normal by my interpretation.    COORDINATION OF CARE: 9:44 PM Discussed treatment plan with pt  at bedside which includes dental f/u and pt agreed to plan.    Labs (all labs ordered are listed, but only abnormal results are displayed) Labs Reviewed - No data to display  Radiology No results found.  Procedures Procedures (including critical care time)  Medications Ordered in ED Medications  amoxicillin (AMOXIL) capsule 500 mg (500 mg Oral Given 12/21/15 2156)  HYDROcodone-acetaminophen (NORCO/VICODIN) 5-325 MG per tablet 1 tablet (1 tablet Oral Given 12/21/15 2156)     Initial Impression / Assessment and Plan / ED Course  I have reviewed the triage vital signs and the nursing notes.  Clinical Course      Patient with dentalgia. On exam, there is no evidence of a drainable abscess. No trismus, glossal elevation, unilateral tonsillar swelling. No evidence of retropharyngeal or peritonsillar abscess or Ludwig angina. Will treat with ibuprofen, amoxicillin. Pt instructed to follow-up with dentist. Resource guide provided with AVS. Discussed return precautions. Pt safe for discharge. Pt is agreeable to plan.      Final Clinical Impressions(s) / ED Diagnoses   Final diagnoses:  Pain, dental    New Prescriptions Discharge Medication List as of 12/21/2015 10:00 PM    START taking these medications   Details  amoxicillin (AMOXIL) 500 MG capsule Take 1 capsule (500 mg total) by mouth 3 (three) times daily., Starting Sat 12/21/2015, Print    naproxen (NAPROSYN) 500 MG tablet Take 1 tablet (500 mg total) by mouth 2 (two) times daily., Starting Sat 12/21/2015, Print        I personally performed the services described in this documentation, which was scribed in my presence. The recorded information has been reviewed and is accurate.    [address]Kevin Harrison Kevin Harrison, [place]NP 12/22/15 [id number]    Rolland PorterMark James, MD 12/28/15 [phone]

## 2015-12-22 ENCOUNTER — Telehealth: Payer: Self-pay | Admitting: *Deleted

## 2015-12-22 NOTE — Telephone Encounter (Signed)
Spoke with Mancel BaleElliott Wentz, MD in Pod E who changed Amoxicillin to Septra DS I po Bid X 7D. #14. No Refills. Changed Allergies to reflect Allergy to PCN. [facility]. No further CM needs at this time.

## 2015-12-22 NOTE — Telephone Encounter (Signed)
Past reaction to PCN: Hives. Will review with MD in Pod E as per policy and call back with alternate medication.

## 2018-12-21 ENCOUNTER — Other Ambulatory Visit: Payer: Self-pay

## 2018-12-21 DIAGNOSIS — Z20822 Contact with and (suspected) exposure to covid-19: Secondary | ICD-10-CM

## 2018-12-22 LAB — NOVEL CORONAVIRUS, NAA: SARS-CoV-2, NAA: NOT DETECTED

## 2019-06-21 ENCOUNTER — Emergency Department (HOSPITAL_COMMUNITY)
Admission: EM | Admit: 2019-06-21 | Discharge: 2019-06-21 | Disposition: A | Discharge status: Home / Self Care | Payer: Managed Care, Other (non HMO) | Attending: Emergency Medicine | Admitting: Emergency Medicine

## 2019-06-21 ENCOUNTER — Telehealth: Payer: Self-pay | Admitting: Hematology and Oncology

## 2019-06-21 ENCOUNTER — Other Ambulatory Visit: Payer: Self-pay

## 2019-06-21 DIAGNOSIS — E876 Hypokalemia: Secondary | ICD-10-CM | POA: Insufficient documentation

## 2019-06-21 DIAGNOSIS — M2550 Pain in unspecified joint: Secondary | ICD-10-CM

## 2019-06-21 DIAGNOSIS — R509 Fever, unspecified: Secondary | ICD-10-CM | POA: Diagnosis present

## 2019-06-21 LAB — CBC WITH DIFFERENTIAL/PLATELET
Abs Immature Granulocytes: 0 10*3/uL (ref 0.00–0.07)
Basophils Absolute: 0 10*3/uL (ref 0.0–0.1)
Basophils Relative: 1 %
Eosinophils Absolute: 0 10*3/uL (ref 0.0–0.5)
Eosinophils Relative: 0 %
HCT: 44.7 % (ref 39.0–52.0)
Hemoglobin: 14.8 g/dL (ref 13.0–17.0)
Lymphocytes Relative: 23 %
Lymphs Abs: 0.5 10*3/uL — ABNORMAL LOW (ref 0.7–4.0)
MCH: 31.4 pg (ref 26.0–34.0)
MCHC: 33.1 g/dL (ref 30.0–36.0)
MCV: 94.9 fL (ref 80.0–100.0)
Monocytes Absolute: 0.1 10*3/uL (ref 0.1–1.0)
Monocytes Relative: 4 %
Neutro Abs: 1.4 10*3/uL — ABNORMAL LOW (ref 1.7–7.7)
Neutrophils Relative %: 72 %
Platelets: 229 10*3/uL (ref 150–400)
RBC: 4.71 MIL/uL (ref 4.22–5.81)
RDW: 12.5 % (ref 11.5–15.5)
WBC: 2 10*3/uL — ABNORMAL LOW (ref 4.0–10.5)
nRBC: 0 % (ref 0.0–0.2)
nRBC: 0 /100 WBC

## 2019-06-21 LAB — COMPREHENSIVE METABOLIC PANEL
ALT: 22 U/L (ref 0–44)
AST: 35 U/L (ref 15–41)
Albumin: 2.9 g/dL — ABNORMAL LOW (ref 3.5–5.0)
Alkaline Phosphatase: 52 U/L (ref 38–126)
Anion gap: 10 (ref 5–15)
BUN: 9 mg/dL (ref 6–20)
CO2: 20 mmol/L — ABNORMAL LOW (ref 22–32)
Calcium: 8.2 mg/dL — ABNORMAL LOW (ref 8.9–10.3)
Chloride: 107 mmol/L (ref 98–111)
Creatinine, Ser: 0.82 mg/dL (ref 0.61–1.24)
GFR calc Af Amer: >60 mL/min (ref >60)
GFR calc non Af Amer: >60 mL/min (ref >60)
Glucose, Bld: 89 mg/dL (ref 70–99)
Potassium: 3.4 mmol/L — ABNORMAL LOW (ref 3.5–5.1)
Sodium: 137 mmol/L (ref 135–145)
Total Bilirubin: 0.8 mg/dL (ref 0.3–1.2)
Total Protein: 6.5 g/dL (ref 6.5–8.1)

## 2019-06-21 NOTE — ED Triage Notes (Signed)
Pt here for eval of fever, joint pain, and swollen lymph nodes since Sunday. Last week got cut with some metal at work, was tx at urgent care with tetanus shot and rx for clindamycin, which he is taking.

## 2019-06-21 NOTE — ED Provider Notes (Signed)
[facility] EMERGENCY DEPARTMENT Provider Note   CSN: [id number] Arrival date & time: 06/21/19  9326     History Chief Complaint  Patient presents with  . Fever  . Joint Pain    Kevin Harrison is a 29 y.o. male.  HPI 29 year old male with no significant medical history presents to the ER with a 1 week history of fever, joint pain in his hands and elbows and swollen cervical lymph nodes.  Patient states that a week and a half ago he cut himself on some metal on his left forearm, was seen at urgent care and given a tetanus shot.  He was then seen by PCP yesterday and started on clindamycin.Marland Kitchen He states that his fevers have been as high as 101.5 for which she has taken Tylenol and ibuprofen.  He states he cannot bring it down to 99, when he stops taking the anti-inflammatories his fever returns.  He states he has joint pains in his hands and elbows especially.  Lymph nodes are swollen to the point that he "definitely notices them" and are only mildly painful.  He denies any sore throat, nasal congestion, nausea, vomiting, abdominal pain, back pain.  No known tick exposures, has not been in any wooded areas.  He has not been vaccinated for Covid.  He was seen at his PCP office yesterday per chart review, and had a negative Covid test.     No past medical history on file.  There are no problems to display for this patient.   No past surgical history on file.     Family History  Problem Relation Age of Onset  . Diabetes Maternal Grandmother   . Diabetes Paternal Grandmother     Social History   Tobacco Use  . Smoking status: Never Smoker  . Smokeless tobacco: Never Used  Substance Use Topics  . Alcohol use: No  . Drug use: No    Home Medications Prior to Admission medications   Medication Sig Start Date End Date Taking? Authorizing Provider  acetaminophen (TYLENOL) 500 MG tablet Take 500 mg by mouth every 6 (six) hours as needed for mild pain or fever.    Yes [provider]  clindamycin (CLEOCIN) 300 MG capsule Take 300 mg by mouth 3 (three) times daily. 06/17/19  Yes [provider]  ibuprofen (ADVIL) 200 MG tablet Take 200 mg by mouth every 6 (six) hours as needed for fever or moderate pain.   Yes [provider]    Allergies    Penicillins  Review of Systems   Review of Systems  Constitutional: Positive for fever.  Respiratory: Negative for cough and shortness of breath.   Cardiovascular: Negative for chest pain.  Gastrointestinal: Negative for abdominal pain and vomiting.  Musculoskeletal: Positive for arthralgias.  Neurological: Negative for weakness.  All other systems reviewed and are negative.   Physical Exam Updated Vital Signs BP 102/72 (BP Location: Left Arm)   Pulse 84   Temp 99.9 F (37.7 C) (Oral)   Resp 18   Ht 6' (1.829 m)   Wt 81.6 kg   SpO2 97%   BMI 24.41 kg/m   Physical Exam Vitals reviewed.  Constitutional:      General: He is not in acute distress.    Appearance: Normal appearance. He is obese. He is not toxic-appearing or diaphoretic.  HENT:     Head: Normocephalic and atraumatic.     Nose: Nose normal.     Mouth/Throat:  Mouth: Mucous membranes are moist.     Pharynx: Oropharynx is clear. No oropharyngeal exudate or posterior oropharyngeal erythema.  Eyes:     General:        Right eye: No discharge.        Left eye: No discharge.     Extraocular Movements: Extraocular movements intact.     Conjunctiva/sclera: Conjunctivae normal.  Cardiovascular:     Rate and Rhythm: Normal rate and regular rhythm.     Pulses: Normal pulses.     Heart sounds: Normal heart sounds.  Pulmonary:     Effort: Pulmonary effort is normal.     Breath sounds: Normal breath sounds.  Abdominal:     General: Abdomen is flat.     Tenderness: There is no abdominal tenderness.  Musculoskeletal:        General: No swelling. Normal range of motion.     Cervical back: Normal range of  motion and neck supple. Tenderness present.  Lymphadenopathy:     Cervical: Cervical adenopathy (Bilaterally enlarged mildly tender cervical lymph nodes.  No overlying fluctuance, erythema, swelling) present.  Skin:    General: Skin is warm.     Capillary Refill: Capillary refill takes less than 2 seconds.     Findings: No erythema, lesion or rash.     Comments: No rashes, bite marks.  Very small healed over laceration on the left posterior forearm with no evidence of erythema, drainage  Neurological:     General: No focal deficit present.     Mental Status: He is alert and oriented to person, place, and time. Mental status is at baseline.  Psychiatric:        Mood and Affect: Mood normal.        Behavior: Behavior normal.     ED Results / Procedures / Treatments   Labs (all labs ordered are listed, but only abnormal results are displayed) Labs Reviewed  CBC WITH DIFFERENTIAL/PLATELET - Abnormal; Notable for the following components:      Result Value   WBC 2.0 (*)    Neutro Abs 1.4 (*)    Lymphs Abs 0.5 (*)    All other components within normal limits  COMPREHENSIVE METABOLIC PANEL - Abnormal; Notable for the following components:   Potassium 3.4 (*)    CO2 20 (*)    Calcium 8.2 (*)    Albumin 2.9 (*)    All other components within normal limits    EKG None  Radiology No results found.  Procedures Procedures (including critical care time)  Medications Ordered in ED Medications - No data to display  ED Course  I have reviewed the triage vital signs and the nursing notes.  Pertinent labs & imaging results that were available during my care of the patient were reviewed by me and considered in my medical decision making (see chart for details).    MDM Rules/Calculators/A&P                     29 year old male with joint pains, fever and cervical lymphadenopathy for 1 week On presentation to the ER, the patient is alert and oriented, nontoxic-appearing, in no acute  respiratory distress.  He has a borderline fever of 99.9, but other vitals are reassuring.  Physical exam with mildly tender bilateral cervical lymph nodes, no evidence of infection from laceration on his forearm.  Lungs clear, abdomen soft and nontender.  No other physical exam abnormalities.  We will start with basic lab work.  He is already on clindamycin.  CBC with leukopenia of 2.0.  Per chart review, value 4 years ago was 3.9.  No other significant abnormalities on CBC.  CMP with mildly decreased potassium of 3.4, CO2 of 20.  No significant electrolyte abnormalities.  Unclear source of fever, arthralgias and lymphadenopathy.  He is overall well-appearing.  Will refer to heme/onc as there is no apparent emergent hematologic source.  I also encouraged the patient to establish with a PCP as he does not have 1.  Directed him to the phone number on the discharge paperwork.  I encouraged him to call the hematologist today and schedule an appointment.  Encouraged him to continue treating his fever with Tylenol/ibuprofen, and to finish the clindamycin.  He voices understanding and is agreeable to the plan.  At this stage in the ED course the patient is medically screened and is stable for discharge.  I discussed case with Dr. Criss Alvine and he is agreeable to the above plan.   Final Clinical Impression(s) / ED Diagnoses Final diagnoses:  Arthralgia, unspecified joint  Fever, unspecified fever cause  Hypokalemia    Rx / DC Orders ED Discharge Orders    None       Mare Ferrari, PA-C 06/21/19 1442    Pricilla Loveless, MD 06/21/19 1541

## 2019-06-21 NOTE — Discharge Instructions (Signed)
Your blood work today showed that you had a low white blood cell count.  These are cells that help fight infection.  We do not know exactly what is causing this, but I highly recommend that you follow-up with the hematology/oncology doctor provided on your discharge paperwork for further work-up.  Your potassium was also a little bit low today.  Please make sure to eat high potassium foods, I have provided a handout for this.  Low potassium can cause life-threatening arrhythmias so please make sure to increase the potassium intake in your diet.  Continue to take your antibiotic and treat your fever with Tylenol and ibuprofen. There will also be a phone number on your discharge paperwork, please call this number to establish with a family doctor. If at any point you have any worsening symptoms, please return to the ER immediately.

## 2019-06-21 NOTE — Telephone Encounter (Signed)
I received a call from Mr. Kevin Harrison who was seen in the hospital for low wbc and enlarged lymph nodes. Mr. Kevin Harrison has been schedueld to see Dr. Leonides Schanz on 6/11 at 10am. Pt aware to arrive 15 minutes ealry.

## 2019-06-23 ENCOUNTER — Other Ambulatory Visit: Payer: Self-pay

## 2019-06-23 ENCOUNTER — Inpatient Hospital Stay: Payer: Managed Care, Other (non HMO) | Attending: Hematology and Oncology | Admitting: Hematology and Oncology

## 2019-06-23 ENCOUNTER — Encounter: Payer: Self-pay | Admitting: Hematology and Oncology

## 2019-06-23 ENCOUNTER — Inpatient Hospital Stay: Payer: Managed Care, Other (non HMO)

## 2019-06-23 VITALS — BP 116/75 | HR 101 | Temp 98.4°F | Resp 19 | Ht 72.0 in | Wt 183.3 lb

## 2019-06-23 DIAGNOSIS — R5383 Other fatigue: Secondary | ICD-10-CM | POA: Diagnosis not present

## 2019-06-23 DIAGNOSIS — R591 Generalized enlarged lymph nodes: Secondary | ICD-10-CM | POA: Diagnosis not present

## 2019-06-23 DIAGNOSIS — T148XXS Other injury of unspecified body region, sequela: Secondary | ICD-10-CM | POA: Diagnosis not present

## 2019-06-23 DIAGNOSIS — Z792 Long term (current) use of antibiotics: Secondary | ICD-10-CM | POA: Insufficient documentation

## 2019-06-23 DIAGNOSIS — R590 Localized enlarged lymph nodes: Secondary | ICD-10-CM | POA: Diagnosis not present

## 2019-06-23 DIAGNOSIS — R509 Fever, unspecified: Secondary | ICD-10-CM | POA: Insufficient documentation

## 2019-06-23 DIAGNOSIS — Z79899 Other long term (current) drug therapy: Secondary | ICD-10-CM | POA: Insufficient documentation

## 2019-06-23 DIAGNOSIS — Z791 Long term (current) use of non-steroidal anti-inflammatories (NSAID): Secondary | ICD-10-CM | POA: Insufficient documentation

## 2019-06-23 DIAGNOSIS — R630 Anorexia: Secondary | ICD-10-CM | POA: Insufficient documentation

## 2019-06-23 DIAGNOSIS — D72819 Decreased white blood cell count, unspecified: Secondary | ICD-10-CM | POA: Diagnosis not present

## 2019-06-23 DIAGNOSIS — X58XXXS Exposure to other specified factors, sequela: Secondary | ICD-10-CM | POA: Insufficient documentation

## 2019-06-23 LAB — MONONUCLEOSIS SCREEN: Mono Screen: NEGATIVE

## 2019-06-23 LAB — SAVE SMEAR(SSMR), FOR PROVIDER SLIDE REVIEW

## 2019-06-23 LAB — CMP (CANCER CENTER ONLY)
ALT: 26 U/L (ref 0–44)
AST: 52 U/L — ABNORMAL HIGH (ref 15–41)
Albumin: 3.2 g/dL — ABNORMAL LOW (ref 3.5–5.0)
Alkaline Phosphatase: 60 U/L (ref 38–126)
Anion gap: 9 (ref 5–15)
BUN: 11 mg/dL (ref 6–20)
CO2: 24 mmol/L (ref 22–32)
Calcium: 8.4 mg/dL — ABNORMAL LOW (ref 8.9–10.3)
Chloride: 105 mmol/L (ref 98–111)
Creatinine: 0.99 mg/dL (ref 0.61–1.24)
GFR, Est AFR Am: >60 mL/min (ref >60)
GFR, Estimated: >60 mL/min (ref >60)
Glucose, Bld: 91 mg/dL (ref 70–99)
Potassium: 3.9 mmol/L (ref 3.5–5.1)
Sodium: 138 mmol/L (ref 135–145)
Total Bilirubin: 0.5 mg/dL (ref 0.3–1.2)
Total Protein: 7.5 g/dL (ref 6.5–8.1)

## 2019-06-23 LAB — HIV ANTIBODY (ROUTINE TESTING W REFLEX): HIV Screen 4th Generation wRfx: NONREACTIVE

## 2019-06-23 LAB — CBC WITH DIFFERENTIAL (CANCER CENTER ONLY)
Abs Immature Granulocytes: 0.01 10*3/uL (ref 0.00–0.07)
Basophils Absolute: 0 10*3/uL (ref 0.0–0.1)
Basophils Relative: 1 %
Eosinophils Absolute: 0 10*3/uL (ref 0.0–0.5)
Eosinophils Relative: 0 %
HCT: 45.9 % (ref 39.0–52.0)
Hemoglobin: 15.4 g/dL (ref 13.0–17.0)
Immature Granulocytes: 1 %
Lymphocytes Relative: 41 %
Lymphs Abs: 0.8 10*3/uL (ref 0.7–4.0)
MCH: 31 pg (ref 26.0–34.0)
MCHC: 33.6 g/dL (ref 30.0–36.0)
MCV: 92.4 fL (ref 80.0–100.0)
Monocytes Absolute: 0.1 10*3/uL (ref 0.1–1.0)
Monocytes Relative: 5 %
Neutro Abs: 1 10*3/uL — ABNORMAL LOW (ref 1.7–7.7)
Neutrophils Relative %: 52 %
Platelet Count: 215 10*3/uL (ref 150–400)
RBC: 4.97 MIL/uL (ref 4.22–5.81)
RDW: 12.6 % (ref 11.5–15.5)
WBC Count: 1.9 10*3/uL — ABNORMAL LOW (ref 4.0–10.5)
nRBC: 0 % (ref 0.0–0.2)

## 2019-06-23 LAB — LACTATE DEHYDROGENASE: LDH: 728 U/L — ABNORMAL HIGH (ref 98–192)

## 2019-06-23 LAB — FOLATE: Folate: 16.9 ng/mL (ref >5.9)

## 2019-06-23 LAB — SEDIMENTATION RATE: Sed Rate: 14 mm/hr (ref 0–16)

## 2019-06-23 LAB — VITAMIN B12: Vitamin B-12: 1379 pg/mL — ABNORMAL HIGH (ref 180–914)

## 2019-06-23 LAB — C-REACTIVE PROTEIN: CRP: 5.8 mg/dL — ABNORMAL HIGH (ref <1.0)

## 2019-06-23 NOTE — Progress Notes (Signed)
[place] [phone]) [phone]   [phone]  INITIAL CONSULT NOTE  Patient Care Team: Patient, No Pcp Per as PCP - General (General Practice)  Hematological/Oncological History # Cervical and Axillary Lymphadenopathy 1) 06/21/2019: patient presented to the emergency department with fevers and lymphadenopathy. Noted to have WBC 2.0, ANC 1.4.  2) 06/23/2019: establish care with Dr. Lorenso Courier   CHIEF COMPLAINTS/PURPOSE OF CONSULTATION:  "Fever and enlarged lymph nodes"  HISTORY OF PRESENTING ILLNESS:  Kevin Harrison 29 y.o. male with no remarkable past medical history who presents for evaluation of cervical and axillary lymphadenopathy.   On review of the previous records Kevin Harrison presented to the emergency department on 06/21/2019 with a 1 week history of fever, joint pain in his hands, elbows and swollen cervical lymph nodes.  He believes that this all began approximately 1.5 weeks ago when he cut himself some on some metal at his place of work with his left forearm.  He was seen in urgent care and given a tetanus shot, and seen by his primary care provider on 06/20/2019 and started on clindamycin antibiotic.  He unfortunately has subsequently developed fevers as high as 101.5 for which she has been taking Tylenol and ibuprofen.  Due to concern for these worsening symptoms he presented to the emergency department for further evaluation and management.  In the emergency department he was found to be leukopenic with a white blood cell count of 2.0.  Due to concern for his fever, lymphadenopathy, and low white blood cell count he was referred to hematology for further evaluation and management.  On exam today Kevin Harrison notes that there has been some improvement in his symptoms since he began taking antibiotics on 06/20/2019.  He notes that he has not been having fevers since, though he has been having some issues with sweats.  Additionally he has no longer having joint pains.   The clindamycin medication has caused him to have some diarrhea.  He does report that he is having fatigue and his appetite has been poor.  He reports that he has been eating some chips in order to take his pills, but otherwise has not been eating full meals.  He also notes that in the interim since his emergency department visit the lymph nodes in his neck have become less palpable.  He currently denies any nausea, vomiting, cough, runny nose, or sore throat.  A full 10 point ROS is listed below.  MEDICAL HISTORY:  History reviewed. No pertinent past medical history.  SURGICAL HISTORY: History reviewed. No pertinent surgical history.  SOCIAL HISTORY: Social History   Socioeconomic History  . Marital status: Single    Spouse name: Not on file  . Number of children: 1  . Years of education: Not on file  . Highest education level: Not on file  Occupational History  . Not on file  Tobacco Use  . Smoking status: Never Smoker  . Smokeless tobacco: Never Used  Vaping Use  . Vaping Use: Every day  Substance and Sexual Activity  . Alcohol use: No  . Drug use: No  . Sexual activity: Not on file  Other Topics Concern  . Not on file  Social History Narrative  . Not on file   Social Determinants of Health   Financial Resource Strain:   . Difficulty of Paying Living Expenses:   Food Insecurity:   . Worried About Charity fundraiser in the Last Year:   . Arboriculturist in  the Last Year:   Transportation Needs:   . Film/video editor (Medical):   Marland Kitchen Lack of Transportation (Non-Medical):   Physical Activity:   . Days of Exercise per Week:   . Minutes of Exercise per Session:   Stress:   . Feeling of Stress :   Social Connections:   . Frequency of Communication with Friends and Family:   . Frequency of Social Gatherings with Friends and Family:   . Attends Religious Services:   . Active Member of Clubs or Organizations:   . Attends Archivist Meetings:   Marland Kitchen Marital  Status:   Intimate Partner Violence:   . Fear of Current or Ex-Partner:   . Emotionally Abused:   Marland Kitchen Physically Abused:   . Sexually Abused:     FAMILY HISTORY: Family History  Problem Relation Age of Onset  . Diabetes Maternal Grandmother   . Diabetes Paternal Grandmother   . Neurologic Disorder Mother   . Healthy Father   . Healthy Brother     ALLERGIES:  is allergic to penicillins.  MEDICATIONS:  Current Outpatient Medications  Medication Sig Dispense Refill  . acetaminophen (TYLENOL) 500 MG tablet Take 500 mg by mouth every 6 (six) hours as needed for mild pain or fever.    . clindamycin (CLEOCIN) 300 MG capsule Take 300 mg by mouth 3 (three) times daily.    Marland Kitchen ibuprofen (ADVIL) 200 MG tablet Take 200 mg by mouth every 6 (six) hours as needed for fever or moderate pain.     No current facility-administered medications for this visit.    REVIEW OF SYSTEMS:   Constitutional: ( - ) fevers, ( - )  chills , ( - ) night sweats Eyes: ( - ) blurriness of vision, ( - ) double vision, ( - ) watery eyes Ears, nose, mouth, throat, and face: ( - ) mucositis, ( - ) sore throat Respiratory: ( - ) cough, ( - ) dyspnea, ( - ) wheezes Cardiovascular: ( - ) palpitation, ( - ) chest discomfort, ( - ) lower extremity swelling Gastrointestinal:  ( - ) nausea, ( - ) heartburn, ( - ) change in bowel habits Skin: ( - ) abnormal skin rashes Lymphatics: ( - ) new lymphadenopathy, ( - ) easy bruising Neurological: ( - ) numbness, ( - ) tingling, ( - ) new weaknesses Behavioral/Psych: ( - ) mood change, ( - ) new changes  All other systems were reviewed with the patient and are negative.  PHYSICAL EXAMINATION: ECOG PERFORMANCE STATUS: 0 - Asymptomatic  Vitals:   06/23/19 0957  BP: 116/75  Pulse: (!) 101  Resp: 19  Temp: 98.4 F (36.9 C)  SpO2: 98%   Filed Weights   06/23/19 0957  Weight: 183 lb 4.8 oz (83.1 kg)    GENERAL: well appearing thin young African American male in NAD    SKIN: skin color, texture, turgor are normal, no rashes or significant lesions. Small puncture wound, well healing, on left arm.  EYES: conjunctiva are pink and non-injected, sclera clear OROPHARYNX: no exudate, no erythema; lips, buccal mucosa, and tongue normal. Cracked molar on right maxilla. Cracked molar on left maxilla.   NECK: supple, non-tender LYMPH:  palpable lymphadenopathy in the right cervical and right axillary. Small pea sized lymph nodes palpated. Fullness in right/left cervical nodes. No evidence of right axillary lymphadenopathy.  LUNGS: clear to auscultation and percussion with normal breathing effort HEART: regular rate & rhythm and no murmurs and no lower  extremity edema ABDOMEN: soft, non-tender, non-distended, normal bowel sounds. HSM no appreciated.  Musculoskeletal: no cyanosis of digits and no clubbing  PSYCH: alert & oriented x 3, fluent speech NEURO: no focal motor/sensory deficits  LABORATORY DATA:  I have reviewed the data as listed CBC Latest Ref Rng & Units 06/23/2019 06/21/2019 06/17/2015  WBC 4.0 - 10.5 K/uL 1.9(L) 2.0(L) 3.9(L)  Hemoglobin 13.0 - 17.0 g/dL 15.4 14.8 14.1  Hematocrit 39 - 52 % 45.9 44.7 42.3  Platelets 150 - 400 K/uL 215 229 258    CMP Latest Ref Rng & Units 06/23/2019 06/21/2019 08/01/2015  Glucose 70 - 99 mg/dL 91 89 77  BUN 6 - 20 mg/dL _0 Creatinine 0.61 - 1.24 mg/dL 0.99 0.82 0.90  Sodium 135 - 145 mmol/L 138 137 140  Potassium 3.5 - 5.1 mmol/L 3.9 3.4(L) 4.0  Chloride 98 - 111 mmol/L 105 107 104  CO2 22 - 32 mmol/L 24 20(L) 27  Calcium 8.9 - 10.3 mg/dL 8.4(L) 8.2(L) 9.7  Total Protein 6.5 - 8.1 g/dL 7.5 6.5 7.0  Total Bilirubin 0.3 - 1.2 mg/dL 0.5 0.8 1.8(H)  Alkaline Phos 38 - 126 U/L 60 52 46  AST 15 - 41 U/L 52(H) 35 40  ALT 0 - 44 U/L 26 22 33    RADIOGRAPHIC STUDIES: No results found.  ASSESSMENT & PLAN Kevin Harrison 29 y.o. male with no remarkable past medical history who presents for evaluation of cervical and  axillary lymphadenopathy.  After review the labs, review the outside records, discussion with the patient the etiology of this patient's lymphadenopathy is not entirely clear at this time.  In the acute phase of lymphadenopathy it is difficult to tell if this is due to an infectious process versus a hematological malignancy or other inflammatory process.  The patient has only had these findings for about a week and a half and was started on antibiotics about 4 days ago.  He is already begun to have resolution of some of his symptoms with no further fevers and resolution of his joint pain.  These findings support a infectious etiology as the likely cause of the patient's symptoms.  We will perform some routine blood tests including LDH, peripheral blood film, CBC, CMP in order to assess and assure that there are no clear warning signs for a hematological process.  Additionally we will order ESR, CRP and HIV serology to rule out other common causes of lymphadenopathy.  Assuming there are no abnormalities found on today's labs we will have the patient return in 4 weeks time for further evaluation.  If he is found to have persistent lymphadenopathy I would recommend that we perform a CT chest abdomen pelvis, flow cytometry, and consideration of a lymph node biopsy.  The patient voiced his understanding of these findings and the plan moving forward.  #Acute Lymphadenopathy of Neck/Axilla #Fevers --this early after developing fevers/ lymphadenopathy it is not easy to determine if the etiology is an infectious source or a hematological malignancy --patient does have a penetrating skin wound which may be the source of his current infectious symptoms. Additionally he may have a viral etiology causing these findings --agree with completion of the clindamycin course and continuing monitoring after therapy.  --prior to obtaining imaging/biopsy we typically allow for 4 weeks for resolution of lymphadenopathy --will  order some preliminary labs including LDH, peripheral blood film, CBC, CMP, and monospot test. Additionally will assess inflammatory markers and HIV serology. --plan for return visit in  4 weeks or sooner if one of the above labs has a finding concerning for hematological malignancy.   Orders Placed This Encounter  Procedures  . CBC with Differential (Cancer Center Only)    Standing Status:   Future    Number of Occurrences:   1    Standing Expiration Date:   06/22/2020  . CMP ([place] only)    Standing Status:   Future    Number of Occurrences:   1    Standing Expiration Date:   06/22/2020  . Lactate dehydrogenase (LDH)    Standing Status:   Future    Number of Occurrences:   1    Standing Expiration Date:   06/22/2020  . Save Smear (SSMR)    Standing Status:   Future    Number of Occurrences:   1    Standing Expiration Date:   06/22/2020  . Sedimentation rate    Standing Status:   Future    Number of Occurrences:   1    Standing Expiration Date:   06/22/2020  . C-reactive protein    Standing Status:   Future    Number of Occurrences:   1    Standing Expiration Date:   06/22/2020  . Folate, Serum    Standing Status:   Future    Number of Occurrences:   1    Standing Expiration Date:   06/22/2020  . Vitamin B12    Standing Status:   Future    Number of Occurrences:   1    Standing Expiration Date:   06/22/2020  . Mononucleosis screen    Standing Status:   Future    Number of Occurrences:   1    Standing Expiration Date:   06/22/2020  . HIV antibody (with reflex)    Standing Status:   Future    Number of Occurrences:   1    Standing Expiration Date:   06/22/2020    All questions were answered. The patient knows to call the clinic with any problems, questions or concerns.  A total of more than 60 minutes were spent on this encounter and over half of that time was spent on counseling and coordination of care as outlined above.   Ledell Peoples, MD Department of  Hematology/Oncology [place] at [facility] Phone: [phone] Pager: [phone] Email: [email or web address]  06/23/2019 1:07 PM

## 2019-06-26 ENCOUNTER — Telehealth: Payer: Self-pay | Admitting: Hematology and Oncology

## 2019-06-26 NOTE — Telephone Encounter (Signed)
Scheduled per los. Called and spoke with patient. Confirmed appt 

## 2019-07-21 ENCOUNTER — Inpatient Hospital Stay: Payer: Managed Care, Other (non HMO) | Attending: Hematology and Oncology | Admitting: Hematology and Oncology

## 2019-07-21 ENCOUNTER — Inpatient Hospital Stay: Payer: Managed Care, Other (non HMO)

## 2019-07-21 ENCOUNTER — Other Ambulatory Visit: Payer: Self-pay

## 2019-07-21 ENCOUNTER — Encounter: Payer: Self-pay | Admitting: Hematology and Oncology

## 2019-07-21 ENCOUNTER — Other Ambulatory Visit: Payer: Self-pay | Admitting: Hematology and Oncology

## 2019-07-21 VITALS — BP 98/62 | HR 70 | Temp 97.9°F | Resp 17 | Ht 72.0 in | Wt 189.8 lb

## 2019-07-21 DIAGNOSIS — R590 Localized enlarged lymph nodes: Secondary | ICD-10-CM | POA: Diagnosis not present

## 2019-07-21 DIAGNOSIS — R509 Fever, unspecified: Secondary | ICD-10-CM

## 2019-07-21 DIAGNOSIS — R591 Generalized enlarged lymph nodes: Secondary | ICD-10-CM

## 2019-07-21 LAB — C-REACTIVE PROTEIN: CRP: 0.6 mg/dL (ref <1.0)

## 2019-07-21 LAB — CBC WITH DIFFERENTIAL (CANCER CENTER ONLY)
Abs Immature Granulocytes: 0.01 10*3/uL (ref 0.00–0.07)
Basophils Absolute: 0 10*3/uL (ref 0.0–0.1)
Basophils Relative: 1 %
Eosinophils Absolute: 0.2 10*3/uL (ref 0.0–0.5)
Eosinophils Relative: 5 %
HCT: 42.3 % (ref 39.0–52.0)
Hemoglobin: 14.1 g/dL (ref 13.0–17.0)
Immature Granulocytes: 0 %
Lymphocytes Relative: 37 %
Lymphs Abs: 1.4 10*3/uL (ref 0.7–4.0)
MCH: 31.8 pg (ref 26.0–34.0)
MCHC: 33.3 g/dL (ref 30.0–36.0)
MCV: 95.5 fL (ref 80.0–100.0)
Monocytes Absolute: 0.3 10*3/uL (ref 0.1–1.0)
Monocytes Relative: 9 %
Neutro Abs: 1.8 10*3/uL (ref 1.7–7.7)
Neutrophils Relative %: 48 %
Platelet Count: 250 10*3/uL (ref 150–400)
RBC: 4.43 MIL/uL (ref 4.22–5.81)
RDW: 13.7 % (ref 11.5–15.5)
WBC Count: 3.8 10*3/uL — ABNORMAL LOW (ref 4.0–10.5)
nRBC: 0 % (ref 0.0–0.2)

## 2019-07-21 LAB — CMP (CANCER CENTER ONLY)
ALT: 42 U/L (ref 0–44)
AST: 31 U/L (ref 15–41)
Albumin: 3.6 g/dL (ref 3.5–5.0)
Alkaline Phosphatase: 58 U/L (ref 38–126)
Anion gap: 7 (ref 5–15)
BUN: 12 mg/dL (ref 6–20)
CO2: 23 mmol/L (ref 22–32)
Calcium: 9 mg/dL (ref 8.9–10.3)
Chloride: 109 mmol/L (ref 98–111)
Creatinine: 0.83 mg/dL (ref 0.61–1.24)
GFR, Est AFR Am: >60 mL/min (ref >60)
GFR, Estimated: >60 mL/min (ref >60)
Glucose, Bld: 91 mg/dL (ref 70–99)
Potassium: 4.1 mmol/L (ref 3.5–5.1)
Sodium: 139 mmol/L (ref 135–145)
Total Bilirubin: 1.4 mg/dL — ABNORMAL HIGH (ref 0.3–1.2)
Total Protein: 7.1 g/dL (ref 6.5–8.1)

## 2019-07-21 LAB — SAVE SMEAR(SSMR), FOR PROVIDER SLIDE REVIEW

## 2019-07-21 LAB — SEDIMENTATION RATE: Sed Rate: 2 mm/hr (ref 0–16)

## 2019-07-21 LAB — LACTATE DEHYDROGENASE: LDH: 237 U/L — ABNORMAL HIGH (ref 98–192)

## 2019-07-23 ENCOUNTER — Encounter: Payer: Self-pay | Admitting: Hematology and Oncology

## 2019-07-23 NOTE — Progress Notes (Signed)
[facility] [phone]   [phone]  PROGRESS NOTE  Patient Care Team: Patient, No Pcp Per as PCP - General (General Practice)  Hematological/Oncological History # Cervical and Axillary Lymphadenopathy 2/2 to Infection 1) 06/21/2019: patient presented to the emergency department with fevers and lymphadenopathy. Noted to have WBC 2.0, ANC 1.4.  2) 06/23/2019: establish care with Dr. Leonides Schanz  3) 07/23/2019: follow up visit with resolution of symptoms, lab abnormalities, and lymphadenopathy   Interval History:  Kevin Harrison 29 y.o. male with no remarkable past medical history who presents a follow up visit for cervical and axillary lymphadenopathy. The patient's last visit was on 06/23/2019 at which time he established care. In the interim since the last visit the patient has completed his abx and had a marked improvement in his clinical status.  Exam today Kevin Harrison notes that he had improvement with the clindamycin antibiotic therapy almost immediately.  He notes that he woke up the next morning after starting therapy and felt better.  He reports that he had some diarrhea with the antibiotic as well as a decrease in appetite, but he had resolution of his lymph nodes and no further episodes of fevers, chills, sweats, nausea, vomiting or weight loss.  He notes that this improved relatively quickly and he has had no side effects since that time.  He has been able to perform his daily activities without any issues of fatigue and is quite happy to have improved.  A full 10 point ROS is listed below.  Medical, Surgical, Social and Family History Reviewed and is up to date.  ALLERGIES:  is allergic to penicillins.  MEDICATIONS:  No current outpatient medications on file.   No current facility-administered medications for this visit.    REVIEW OF SYSTEMS:   Constitutional: ( - ) fevers, ( - )  chills , ( - ) night sweats Eyes: ( - ) blurriness of vision, ( - )  double vision, ( - ) watery eyes Ears, nose, mouth, throat, and face: ( - ) mucositis, ( - ) sore throat Respiratory: ( - ) cough, ( - ) dyspnea, ( - ) wheezes Cardiovascular: ( - ) palpitation, ( - ) chest discomfort, ( - ) lower extremity swelling Gastrointestinal:  ( - ) nausea, ( - ) heartburn, ( - ) change in bowel habits Skin: ( - ) abnormal skin rashes Lymphatics: ( - ) new lymphadenopathy, ( - ) easy bruising Neurological: ( - ) numbness, ( - ) tingling, ( - ) new weaknesses Behavioral/Psych: ( - ) mood change, ( - ) new changes  All other systems were reviewed with the patient and are negative.  PHYSICAL EXAMINATION: ECOG PERFORMANCE STATUS: 0 - Asymptomatic  Vitals:   07/21/19 1028  BP: 98/62  Pulse: 70  Resp: 17  Temp: 97.9 F (36.6 C)  SpO2: 99%   Filed Weights   07/21/19 1028  Weight: 189 lb 12.8 oz (86.1 kg)    GENERAL: alert, no distress and comfortable SKIN: skin color, texture, turgor are normal, no rashes or significant lesions EYES: conjunctiva are pink and non-injected, sclera clear NECK: supple, non-tender LYMPH:  no palpable lymphadenopathy in the cervical, axillary LUNGS: clear to auscultation and percussion with normal breathing effort HEART: regular rate & rhythm and no murmurs and no lower extremity edema Musculoskeletal: no cyanosis of digits and no clubbing  PSYCH: alert & oriented x 3, fluent speech NEURO: no focal motor/sensory deficits  LABORATORY DATA:  I have reviewed  the data as listed CBC Latest Ref Rng & Units 07/21/2019 06/23/2019 06/21/2019  WBC 4.0 - 10.5 K/uL 3.8(L) 1.9(L) 2.0(L)  Hemoglobin 13.0 - 17.0 g/dL 57.3 22.0 25.4  Hematocrit 39 - 52 % 42.3 45.9 44.7  Platelets 150 - 400 K/uL 250 215 229    CMP Latest Ref Rng & Units 07/21/2019 06/23/2019 06/21/2019  Glucose 70 - 99 mg/dL 91 91 89  BUN 6 - 20 mg/dL 12 11 9   Creatinine 0.61 - 1.24 mg/dL 2.70 6.23  Sodium 135 - 145 mmol/L 139 138 137  Potassium 3.5 - 5.1 mmol/L 4.1 3.9  3.4(L)  Chloride 98 - 111 mmol/L 109 105 107  CO2 22 - 32 mmol/L 23 24 20(L)  Calcium 8.9 - 10.3 mg/dL 9.0 7.62) 8.3(T)  Total Protein 6.5 - 8.1 g/dL 7.1 7.5 6.5  Total Bilirubin 0.3 - 1.2 mg/dL 5.1(V) 0.5 0.8  Alkaline Phos 38 - 126 U/L 58 60 52  AST 15 - 41 U/L 31 52(H) 35  ALT 0 - 44 U/L 42 26 22    RADIOGRAPHIC STUDIES: No results found.  ASSESSMENT & PLAN Kevin Harrison 29 y.o. male with no remarkable past medical history who presents a follow up visit for cervical and axillary lymphadenopathy.  After review the labs, examined the patient, and discussion with the patient the findings are consistent with a lymphadenopathy secondary to infection.  His symptoms have improved markedly with antibiotic therapy and has had no further issues in the interim since our last visit.  He reports complete and total resolution of fevers, chills and sweats.  Overall he is returned to his baseline level of health.  As such the most likely etiology for the patient's findings were a transient infection appropriately treated with antibiotic therapy.  There is no need for further routine follow-up in our clinic at this time.  # Cervical and Axillary Lymphadenopathy 2/2 to Infection --lymphadenopathy, fevers, and sweats resolved with abx therapy. --no weight loss or other findings concerning for an alternative etiology for the patient's symptoms. --bloodwork and physical exam have normalized.  --RTC PRN.   No orders of the defined types were placed in this encounter.   All questions were answered. The patient knows to call the clinic with any problems, questions or concerns.  A total of more than 25 minutes were spent on this encounter and over half of that time was spent on counseling and coordination of care as outlined above.   30, MD Department of Hematology/Oncology [facility] Phone: [phone] Pager: [phone] Email:  [email or web address]  07/23/2019 5:52 PM

## 2023-10-28 ENCOUNTER — Other Ambulatory Visit: Payer: Self-pay

## 2023-10-28 ENCOUNTER — Emergency Department (HOSPITAL_COMMUNITY)
Admission: EM | Admit: 2023-10-28 | Discharge: 2023-10-28 | Disposition: A | Discharge status: Home / Self Care | Attending: Emergency Medicine | Admitting: Emergency Medicine

## 2023-10-28 ENCOUNTER — Encounter (HOSPITAL_COMMUNITY): Payer: Self-pay

## 2023-10-28 DIAGNOSIS — Y92410 Unspecified street and highway as the place of occurrence of the external cause: Secondary | ICD-10-CM | POA: Diagnosis not present

## 2023-10-28 DIAGNOSIS — M7989 Other specified soft tissue disorders: Secondary | ICD-10-CM | POA: Diagnosis not present

## 2023-10-28 DIAGNOSIS — M79632 Pain in left forearm: Secondary | ICD-10-CM | POA: Diagnosis present

## 2023-10-28 DIAGNOSIS — M7918 Myalgia, other site: Secondary | ICD-10-CM

## 2023-10-28 MED ORDER — IBUPROFEN 200 MG PO TABS
600.0000 mg | ORAL_TABLET | Freq: Once | ORAL | Status: AC
  Administered 2023-10-28: 600 mg via ORAL
  Filled 2023-10-28: qty 3

## 2023-10-28 MED ORDER — METHOCARBAMOL 500 MG PO TABS
1000.0000 mg | ORAL_TABLET | Freq: Three times a day (TID) | ORAL | 0 refills | Status: AC | PRN
Start: 2023-10-28 — End: ?

## 2023-10-28 MED ORDER — IBUPROFEN 600 MG PO TABS: 600.0000 mg | ORAL_TABLET | Freq: Four times a day (QID) | ORAL | 0 refills | Status: AC | PRN
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
## 2023-10-28 NOTE — Discharge Instructions (Signed)
 Recommend cool compresses to the sore areas in the first 48 hours. After that you can alternate warm and cool, whatever feels best. Take ibuprofen regularly every 6 hours for soreness and inflammation. Take Robaxin (muscle relaxer) up to 3 times daily as needed.   Return to the ED with any new or concerning symptoms.
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
## 2024-01-04 ENCOUNTER — Emergency Department (HOSPITAL_COMMUNITY)
Admission: EM | Admit: 2024-01-04 | Discharge: 2024-01-04 | Disposition: A | Discharge status: Home / Self Care | Payer: Self-pay

## 2024-01-04 ENCOUNTER — Encounter (HOSPITAL_COMMUNITY): Payer: Self-pay | Admitting: Emergency Medicine

## 2024-01-04 ENCOUNTER — Other Ambulatory Visit: Payer: Self-pay

## 2024-01-04 DIAGNOSIS — R590 Localized enlarged lymph nodes: Secondary | ICD-10-CM | POA: Insufficient documentation

## 2024-01-04 DIAGNOSIS — R591 Generalized enlarged lymph nodes: Secondary | ICD-10-CM

## 2024-01-04 LAB — MONONUCLEOSIS SCREEN: Mono Screen: NEGATIVE

## 2024-01-04 LAB — RESP PANEL BY RT-PCR (RSV, FLU A&B, COVID)  RVPGX2
Influenza A by PCR: NEGATIVE
Influenza B by PCR: NEGATIVE
Resp Syncytial Virus by PCR: NEGATIVE
SARS Coronavirus 2 by RT PCR: NEGATIVE

## 2024-01-04 NOTE — ED Provider Notes (Signed)
 " [place] [facility] AT [facility] Provider Note   CSN: [id number] Arrival date & time: 01/04/24  1455     Patient presents with: Adenopathy and Headache   Kevin Harrison is a 33 y.o. male who comes in with primary concern of swollen lymph nodes on the front of his neck as well as under his armpits over the last week.  He states he has also had a mild rash around the neck over the last week, describes it as bumpy sometimes itchy.  He does state he has had some intermittent mild global headaches but does not have any other upper respiratory symptoms.  He has been searching his symptoms online and feels he may have mononucleosis and request testing for the same.    Headache      Prior to Admission medications  Medication Sig Start Date End Date Taking? Authorizing Provider  ibuprofen  (ADVIL ) 600 MG tablet Take 1 tablet (600 mg total) by mouth every 6 (six) hours as needed. 10/28/23   Odell Balls, PA-C  methocarbamol  (ROBAXIN ) 500 MG tablet Take 2 tablets (1,000 mg total) by mouth every 8 (eight) hours as needed for muscle spasms. 10/28/23   Odell Balls, PA-C    Allergies: Penicillins    Review of Systems  Neurological:  Positive for headaches.  All other systems reviewed and are negative.   Updated Vital Signs BP (!) 141/73 (BP Location: Right Arm)   Pulse 68   Temp 98.6 F (37 C) (Oral)   Resp 16   SpO2 98%   Physical Exam Vitals and nursing note reviewed.  Constitutional:      General: He is not in acute distress.    Appearance: Normal appearance.  HENT:     Head: Normocephalic and atraumatic.     Mouth/Throat:     Mouth: Mucous membranes are moist.     Pharynx: Oropharynx is clear. Uvula midline.  Eyes:     Extraocular Movements: Extraocular movements intact.     Conjunctiva/sclera: Conjunctivae normal.     Pupils: Pupils are equal, round, and reactive to light.  Cardiovascular:     Rate and Rhythm: Normal rate and regular  rhythm.     Pulses: Normal pulses.     Heart sounds: Normal heart sounds. No murmur heard.    No friction rub. No gallop.  Pulmonary:     Effort: Pulmonary effort is normal.     Breath sounds: Normal breath sounds.  Abdominal:     General: Abdomen is flat. Bowel sounds are normal.     Palpations: Abdomen is soft.  Musculoskeletal:        General: Normal range of motion.     Cervical back: Normal range of motion and neck supple.     Right lower leg: No edema.     Left lower leg: No edema.  Lymphadenopathy:     Cervical: Cervical adenopathy present.     Right cervical: Superficial cervical adenopathy present. No posterior cervical adenopathy.    Left cervical: Superficial cervical adenopathy present. No posterior cervical adenopathy.     Upper Body:     Right upper body: No axillary adenopathy.     Left upper body: No axillary adenopathy.  Skin:    General: Skin is warm and dry.     Capillary Refill: Capillary refill takes less than 2 seconds.  Neurological:     General: No focal deficit present.     Mental Status: He is alert and oriented to person,  place, and time. Mental status is at baseline.     GCS: GCS eye subscore is 4. GCS verbal subscore is 5. GCS motor subscore is 6.  Psychiatric:        Mood and Affect: Mood normal.     (all labs ordered are listed, but only abnormal results are displayed) Labs Reviewed  RESP PANEL BY RT-PCR (RSV, FLU A&B, COVID)  [id number]  MONONUCLEOSIS SCREEN    EKG: None  Radiology: No results found.   Procedures   Medications Ordered in the ED - No data to display                                  Medical Decision Making Amount and/or Complexity of Data Reviewed Labs: ordered.   Given the patient's presenting signs and symptoms viral respiratory panel was obtained by nasopharyngeal swab which did not show any positive results.  Further he does not have any posterior lymphadenopathy, and does not have any upper abdominal  tenderness.  That patient request we will test for mononucleosis.  This was negative.  Given that sinus is likely lymphadenopathy secondary to unspecified viral etiology however will refer to primary care for continued management and workup especially if symptoms continue to be present after the next several weeks.  He understands and agrees has no further concerns at this time.  Since there are no concerning findings on the evaluation of the workup and patient has remained stable during his time here in the ED will discharge with outpatient follow-up as previously discussed.     Final diagnoses:  Lymphadenopathy    ED Discharge Orders     None          Myriam Dorn BROCKS, [place] 01/04/24 1813    Gennaro Duwaine CROME, DO 01/10/24 1540  "

## 2024-01-04 NOTE — ED Triage Notes (Signed)
 Ptreports swollen lymph nodes in neck & under arm pits x 1 week. Also reports rash around neck x 1 week. Pt c/o headaches & chills but denies runny nose, cough, sore throat. Reports he wants to be tested for mono.
